# Patient Record
Sex: Male | Born: 1995 | Race: Black or African American | Hispanic: No | Marital: Single | Smoking: Never smoker
Health system: Southern US, Community
[De-identification: ages and names within clinical notes are randomized; demographics above are authoritative.]

---

## 2016-02-16 ENCOUNTER — Emergency Department (HOSPITAL_COMMUNITY)
Admission: EM | Admit: 2016-02-16 | Discharge: 2016-02-16 | Disposition: A | Discharge status: Home / Self Care | Payer: No Typology Code available for payment source | Attending: Emergency Medicine | Admitting: Emergency Medicine

## 2016-02-16 ENCOUNTER — Emergency Department (HOSPITAL_COMMUNITY): Payer: No Typology Code available for payment source

## 2016-02-16 ENCOUNTER — Encounter (HOSPITAL_COMMUNITY): Payer: Self-pay | Admitting: Emergency Medicine

## 2016-02-16 DIAGNOSIS — S29012A Strain of muscle and tendon of back wall of thorax, initial encounter: Secondary | ICD-10-CM | POA: Insufficient documentation

## 2016-02-16 DIAGNOSIS — Y92410 Unspecified street and highway as the place of occurrence of the external cause: Secondary | ICD-10-CM | POA: Diagnosis not present

## 2016-02-16 DIAGNOSIS — S8001XA Contusion of right knee, initial encounter: Secondary | ICD-10-CM | POA: Diagnosis not present

## 2016-02-16 DIAGNOSIS — Y999 Unspecified external cause status: Secondary | ICD-10-CM | POA: Diagnosis not present

## 2016-02-16 DIAGNOSIS — M7918 Myalgia, other site: Secondary | ICD-10-CM

## 2016-02-16 DIAGNOSIS — S199XXA Unspecified injury of neck, initial encounter: Secondary | ICD-10-CM | POA: Diagnosis present

## 2016-02-16 DIAGNOSIS — S161XXA Strain of muscle, fascia and tendon at neck level, initial encounter: Secondary | ICD-10-CM

## 2016-02-16 DIAGNOSIS — Y939 Activity, unspecified: Secondary | ICD-10-CM | POA: Diagnosis not present

## 2016-02-16 MED ORDER — TRAMADOL HCL 50 MG PO TABS: 50.0000 mg | ORAL_TABLET | Freq: Four times a day (QID) | ORAL | 0 refills | Status: DC | PRN

## 2016-02-16 MED ORDER — IBUPROFEN 800 MG PO TABS: 800.0000 mg | ORAL_TABLET | Freq: Three times a day (TID) | ORAL | 0 refills | Status: DC | PRN

## 2016-02-16 NOTE — Discharge Instructions (Signed)
Your x-rays were normal. Return here as needed. Use ice and heat on the areas that are sore. °
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
## 2016-10-12 ENCOUNTER — Emergency Department (HOSPITAL_COMMUNITY)
Admission: EM | Admit: 2016-10-12 | Discharge: 2016-10-13 | Disposition: A | Discharge status: Home / Self Care | Payer: No Typology Code available for payment source | Attending: Emergency Medicine | Admitting: Emergency Medicine

## 2016-10-12 ENCOUNTER — Encounter (HOSPITAL_COMMUNITY): Payer: Self-pay | Admitting: Emergency Medicine

## 2016-10-12 DIAGNOSIS — Y999 Unspecified external cause status: Secondary | ICD-10-CM | POA: Insufficient documentation

## 2016-10-12 DIAGNOSIS — R51 Headache: Secondary | ICD-10-CM | POA: Insufficient documentation

## 2016-10-12 DIAGNOSIS — M25562 Pain in left knee: Secondary | ICD-10-CM | POA: Insufficient documentation

## 2016-10-12 DIAGNOSIS — Y92410 Unspecified street and highway as the place of occurrence of the external cause: Secondary | ICD-10-CM | POA: Insufficient documentation

## 2016-10-12 DIAGNOSIS — M545 Low back pain: Secondary | ICD-10-CM | POA: Insufficient documentation

## 2016-10-12 DIAGNOSIS — Y9389 Activity, other specified: Secondary | ICD-10-CM | POA: Insufficient documentation

## 2016-10-12 DIAGNOSIS — M25561 Pain in right knee: Secondary | ICD-10-CM | POA: Insufficient documentation
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
## 2016-10-13 MED ORDER — METHOCARBAMOL 500 MG PO TABS: 500.0000 mg | ORAL_TABLET | Freq: Two times a day (BID) | ORAL | 0 refills | Status: DC

## 2016-10-13 MED ORDER — IBUPROFEN 600 MG PO TABS: 600.0000 mg | ORAL_TABLET | Freq: Four times a day (QID) | ORAL | 0 refills | Status: DC | PRN

## 2016-10-13 MED ORDER — IBUPROFEN 200 MG PO TABS
600.0000 mg | ORAL_TABLET | Freq: Once | ORAL | Status: AC
  Administered 2016-10-13: 600 mg via ORAL
  Filled 2016-10-13: qty 3

## 2016-10-13 NOTE — Discharge Instructions (Signed)
Take your medications as prescribed. I also recommend applying ice and/or heat to affected area for 15-20 minutes 3-4 times daily for additional pain relief. Refrain from doing any heavy lifting, squatting or repetitive movements that exacerbate your symptoms. °Follow-up with your primary care provider in the next week if her symptoms have not improved.  °Please return to the Emergency Department if symptoms worsen or new onset of fever, numbness, tingling, groin anesthesia, loss of bowel or bladder, weakness. °
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
## 2017-04-09 ENCOUNTER — Other Ambulatory Visit: Payer: Self-pay

## 2017-04-09 ENCOUNTER — Ambulatory Visit (HOSPITAL_COMMUNITY)
Admission: EM | Admit: 2017-04-09 | Discharge: 2017-04-09 | Disposition: A | Discharge status: Home / Self Care | Payer: Self-pay | Attending: Physician Assistant | Admitting: Physician Assistant

## 2017-04-09 ENCOUNTER — Encounter (HOSPITAL_COMMUNITY): Payer: Self-pay | Admitting: Emergency Medicine

## 2017-04-09 DIAGNOSIS — Z202 Contact with and (suspected) exposure to infections with a predominantly sexual mode of transmission: Secondary | ICD-10-CM

## 2017-04-09 DIAGNOSIS — Z113 Encounter for screening for infections with a predominantly sexual mode of transmission: Secondary | ICD-10-CM

## 2017-04-09 DIAGNOSIS — R369 Urethral discharge, unspecified: Secondary | ICD-10-CM

## 2017-04-09 MED ORDER — AZITHROMYCIN 250 MG PO TABS
1000.0000 mg | ORAL_TABLET | Freq: Once | ORAL | Status: AC
  Administered 2017-04-09: 1000 mg via ORAL

## 2017-04-09 MED ORDER — CEFTRIAXONE SODIUM 250 MG IJ SOLR
250.0000 mg | Freq: Once | INTRAMUSCULAR | Status: AC
  Administered 2017-04-09: 250 mg via INTRAMUSCULAR

## 2017-04-09 MED ORDER — CEFTRIAXONE SODIUM 250 MG IJ SOLR
INTRAMUSCULAR | Status: AC
  Filled 2017-04-09: qty 250

## 2017-04-09 MED ORDER — AZITHROMYCIN 250 MG PO TABS
ORAL_TABLET | ORAL | Status: AC
  Filled 2017-04-09: qty 4

## 2017-04-09 NOTE — ED Triage Notes (Signed)
[facility] a [facility] x 4 days. The patient reported that a partner called him and told him that she had tested positive for gonorrhea.

## 2017-04-09 NOTE — ED Provider Notes (Addendum)
[facility]    CSN: [id number] Arrival date & time: 04/09/17  1643     History   Chief Complaint Chief Complaint  Patient presents with  . Penile Discharge    HPI Anthony Burgess is a 22 y.o. male.   The history is provided by the patient. No language interpreter was used.  Penile Discharge  This is a new problem. Episode onset: 4 days. The problem occurs constantly. The problem has been gradually worsening. Nothing aggravates the symptoms. Nothing relieves the symptoms. He has tried nothing for the symptoms.   Pt reports partner advised him she had gonorrhea.  History reviewed. No pertinent past medical history.  There are no active problems to display for this patient.   History reviewed. No pertinent surgical history.     Home Medications    Prior to Admission medications   Not on File    Family History History reviewed. No pertinent family history.  Social History Social History   Tobacco Use  . Smoking status: Never Smoker  . Smokeless tobacco: Never Used  Substance Use Topics  . Alcohol use: No  . Drug use: No     Allergies   Patient has no known allergies.   Review of Systems Review of Systems  Genitourinary: Positive for discharge.  Skin: Negative for color change and rash.  All other systems reviewed and are negative.    Physical Exam Triage Vital Signs ED Triage Vitals [04/09/17 1715]  Enc Vitals Group     BP (!) 118/47     Pulse Rate (!) 54     Resp 16     Temp 98.4 F (36.9 C)     Temp Source Oral     SpO2 100 %     Weight      Height      Head Circumference      Peak Flow      Pain Score 0     Pain Loc      Pain Edu?      Excl. in GC?    No data found.  Updated Vital Signs BP (!) 118/47 (BP Location: Left Arm)   Pulse (!) 54   Temp 98.4 F (36.9 C) (Oral)   Resp 16   SpO2 100%   Visual Acuity Right Eye Distance:   Left Eye Distance:   Bilateral Distance:    Right Eye Near:   Left Eye Near:      Bilateral Near:     Physical Exam  Constitutional: He appears well-developed and well-nourished.  Cardiovascular: Normal rate.  Pulmonary/Chest: Effort normal.  Neurological: He is alert.  Skin: Skin is warm.  Psychiatric: He has a normal mood and affect.     UC Treatments / Results  Labs (all labs ordered are listed, but only abnormal results are displayed) Labs Reviewed  URINE CYTOLOGY ANCILLARY ONLY    EKG  EKG Interpretation None       Radiology No results found.  Procedures Procedures (including critical care time)  Medications Ordered in UC Medications  cefTRIAXone (ROCEPHIN) injection 250 mg (250 mg Intramuscular Given 04/09/17 1808)  azithromycin (ZITHROMAX) tablet 1,000 mg (1,000 mg Oral Given 04/09/17 1808)     Initial Impression / Assessment and Plan / UC Course  I have reviewed the triage vital signs and the nursing notes.  Pertinent labs & imaging results that were available during my care of the patient were reviewed by me and considered in my medical decision making (  see chart for details).     Meds ordered this encounter  Medications  . cefTRIAXone (ROCEPHIN) injection 250 mg    Order Specific Question:   Antibiotic Indication:    Answer:   STD  . azithromycin (ZITHROMAX) tablet 1,000 mg    Final Clinical Impressions(s) / UC Diagnoses   Final diagnoses:  STD exposure    ED Discharge Orders    None       Controlled Substance Prescriptions [place] Controlled Substance Registry consulted? Not Applicable   Osie Cheeks 04/09/17 1811    Elson Areas, PA-C 04/09/17 1812

## 2017-04-09 NOTE — ED Notes (Signed)
Dirty and clean urine collected. 

## 2017-04-10 LAB — URINE CYTOLOGY ANCILLARY ONLY
Chlamydia: NEGATIVE
Neisseria Gonorrhea: NEGATIVE
TRICH (WINDOWPATH): NEGATIVE
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
